# Patient Record
Sex: Male | Born: 1955 | Race: White | Hispanic: No | Marital: Single | Smoking: Current every day smoker
Health system: Southern US, Community
[De-identification: ages and names within clinical notes are randomized; demographics above are authoritative.]

## PROBLEM LIST (undated history)

## (undated) DIAGNOSIS — T7840XA Allergy, unspecified, initial encounter: Secondary | ICD-10-CM

## (undated) HISTORY — DX: Allergy, unspecified, initial encounter: T78.40XA

## (undated) HISTORY — PX: NASAL SINUS SURGERY: SHX719

---

## 2013-03-18 ENCOUNTER — Telehealth: Payer: Self-pay | Admitting: Nurse Practitioner

## 2013-03-18 NOTE — Telephone Encounter (Signed)
APPT MADE

## 2013-03-21 ENCOUNTER — Encounter: Payer: Self-pay | Admitting: Family Medicine

## 2013-03-21 ENCOUNTER — Encounter (INDEPENDENT_AMBULATORY_CARE_PROVIDER_SITE_OTHER): Payer: Self-pay

## 2013-03-21 ENCOUNTER — Ambulatory Visit (INDEPENDENT_AMBULATORY_CARE_PROVIDER_SITE_OTHER): Payer: BC Managed Care – PPO | Admitting: Family Medicine

## 2013-03-21 ENCOUNTER — Ambulatory Visit (INDEPENDENT_AMBULATORY_CARE_PROVIDER_SITE_OTHER): Payer: BC Managed Care – PPO

## 2013-03-21 VITALS — BP 143/82 | HR 76 | Temp 97.4°F | Ht 68.0 in | Wt 185.4 lb

## 2013-03-21 DIAGNOSIS — F17200 Nicotine dependence, unspecified, uncomplicated: Secondary | ICD-10-CM

## 2013-03-21 DIAGNOSIS — M940 Chondrocostal junction syndrome [Tietze]: Secondary | ICD-10-CM

## 2013-03-21 NOTE — Patient Instructions (Signed)

## 2013-03-21 NOTE — Progress Notes (Signed)
   Subjective:    Patient ID: Grant Lawson, male    DOB: 04/29/1955, 58 y.o.   MRN: [id number]  HPI This 58 y.o. male presents for evaluation of left chest mass.  He has noticed over the Past few months a hard nodular area on his left costal area.  He is a smoker   Review of Systems No chest pain, SOB, HA, dizziness, vision change, N/V, diarrhea, constipation, dysuria, urinary urgency or frequency, myalgias, arthralgias or rash.     Objective:   Physical Exam  Vital signs noted  Well developed well nourished male.  HEENT - Head atraumatic Normocephalic Respiratory - Lungs CTA bilateral Cardiac - RRR S1 and S2 without murmur MS - Left chest with hard nodular area over costal and prominent manubrium/sternum Notch.     CXR - No infiltrates Prelimnary reading by Angeline SlimWilliam Kendryck Lacroix,FNP Assessment & Plan:  Smoker - Plan: DG Chest 2 View  Costochondritis - Plan: DG Chest 2 View  Reassured patient that this is probably not cancer and is probably just costochondritis. Recommend he follow up for CPE.  Deatra CanterWilliam J Brisa Auth FNP

## 2013-03-25 ENCOUNTER — Telehealth: Payer: Self-pay | Admitting: Family Medicine

## 2013-03-26 NOTE — Telephone Encounter (Signed)
Pt wants xray results. Can you review? Thanks.

## 2013-03-27 NOTE — Telephone Encounter (Signed)
Can you discuss with patient mom

## 2013-03-27 NOTE — Telephone Encounter (Signed)
He has small lipoma over manubrium which is benign and would just watch

## 2014-06-25 ENCOUNTER — Ambulatory Visit (INDEPENDENT_AMBULATORY_CARE_PROVIDER_SITE_OTHER): Payer: BLUE CROSS/BLUE SHIELD | Admitting: Family Medicine

## 2014-06-25 ENCOUNTER — Ambulatory Visit (INDEPENDENT_AMBULATORY_CARE_PROVIDER_SITE_OTHER): Payer: BLUE CROSS/BLUE SHIELD

## 2014-06-25 ENCOUNTER — Encounter: Payer: Self-pay | Admitting: Family Medicine

## 2014-06-25 VITALS — BP 122/80 | HR 75 | Temp 97.3°F | Ht 68.0 in | Wt 178.4 lb

## 2014-06-25 DIAGNOSIS — J209 Acute bronchitis, unspecified: Secondary | ICD-10-CM

## 2014-06-25 DIAGNOSIS — R05 Cough: Secondary | ICD-10-CM

## 2014-06-25 DIAGNOSIS — M791 Myalgia: Secondary | ICD-10-CM | POA: Diagnosis not present

## 2014-06-25 DIAGNOSIS — M609 Myositis, unspecified: Secondary | ICD-10-CM

## 2014-06-25 DIAGNOSIS — Z720 Tobacco use: Secondary | ICD-10-CM

## 2014-06-25 DIAGNOSIS — F1910 Other psychoactive substance abuse, uncomplicated: Secondary | ICD-10-CM

## 2014-06-25 DIAGNOSIS — J40 Bronchitis, not specified as acute or chronic: Secondary | ICD-10-CM | POA: Diagnosis not present

## 2014-06-25 DIAGNOSIS — IMO0001 Reserved for inherently not codable concepts without codable children: Secondary | ICD-10-CM

## 2014-06-25 DIAGNOSIS — R059 Cough, unspecified: Secondary | ICD-10-CM

## 2014-06-25 LAB — POCT CBC
Granulocyte percent: 84.5 %G — AB (ref 37–80)
HCT, POC: 53 % (ref 43.5–53.7)
Hemoglobin: 16.8 g/dL (ref 14.1–18.1)
Lymph, poc: 1.6 (ref 0.6–3.4)
MCH, POC: 28.9 pg (ref 27–31.2)
MCHC: 31.6 g/dL — AB (ref 31.8–35.4)
MCV: 91.5 fL (ref 80–97)
MPV: 8 fL (ref 0–99.8)
POC Granulocyte: 10.4 — AB (ref 2–6.9)
POC LYMPH PERCENT: 13 %L (ref 10–50)
Platelet Count, POC: 222 10*3/uL (ref 142–424)
RBC: 5.79 M/uL (ref 4.69–6.13)
RDW, POC: 13.7 %
WBC: 12.3 10*3/uL — AB (ref 4.6–10.2)

## 2014-06-25 LAB — POCT INFLUENZA A/B
Influenza A, POC: NEGATIVE
Influenza B, POC: NEGATIVE

## 2014-06-25 MED ORDER — AZITHROMYCIN 250 MG PO TABS: ORAL_TABLET | ORAL | Status: AC

## 2014-06-25 MED ORDER — ALBUTEROL SULFATE HFA 108 (90 BASE) MCG/ACT IN AERS: 2.0000 | INHALATION_SPRAY | Freq: Four times a day (QID) | RESPIRATORY_TRACT | Status: AC | PRN

## 2014-06-25 NOTE — Progress Notes (Signed)
Subjective:    Patient ID: Grant Lawson, male    DOB: [birth year], 57 y.o.   MRN: [id number]  HPI Patient here today for sinus, cough and fever that started over the weekend or actually since 2 days ago. The chest has been sore and he is not had any sore throat or problems voiding. The patient is a smoker.        There are no active problems to display for this patient.  No outpatient encounter prescriptions on file as of 06/25/2014.   Review of Systems  Constitutional: Positive for fever.  HENT: Positive for sinus pressure.   Eyes: Negative.   Respiratory: Positive for cough.   Cardiovascular: Negative.   Gastrointestinal: Negative.   Endocrine: Negative.   Genitourinary: Negative.   Musculoskeletal: Negative.   Skin: Negative.   Allergic/Immunologic: Negative.   Neurological: Negative.   Hematological: Negative.   Psychiatric/Behavioral: Negative.        Objective:   Physical Exam  Constitutional: He is oriented to person, place, and time. He appears well-developed and well-nourished. No distress.  HENT:  Head: Normocephalic and atraumatic.  Right Ear: External ear normal.  Left Ear: External ear normal.  Mouth/Throat: Oropharynx is clear and moist. No oropharyngeal exudate.  Nasal congestion bilaterally  Eyes: Conjunctivae and EOM are normal. Pupils are equal, round, and reactive to light. Right eye exhibits no discharge. Left eye exhibits no discharge. No scleral icterus.  Neck: Normal range of motion. Neck supple. No thyromegaly present.  No anterior cervical nodes  Cardiovascular: Normal rate, regular rhythm and normal heart sounds.   No murmur heard. At 72/m  Pulmonary/Chest: Effort normal. No respiratory distress. He has wheezes. He has no rales. He exhibits no tenderness.  Rhonchi and wheezes in right chest posteriorly. Patient has congested cough  Abdominal: Soft. Bowel sounds are normal. He exhibits no mass. There is no tenderness. There is no rebound and  no guarding.  Musculoskeletal: Normal range of motion. He exhibits no edema.  Lymphadenopathy:    He has no cervical adenopathy.  Neurological: He is alert and oriented to person, place, and time.  Skin: Skin is warm and dry. No rash noted. No erythema. No pallor.  Psychiatric: He has a normal mood and affect. His behavior is normal. Judgment and thought content normal.  Nursing note and vitals reviewed.  BP 148/94 mmHg  Pulse 75  Temp(Src) 97.3 F (36.3 C) (Oral)  Ht  (1.727 m)  Wt 178 lb 6.4 oz (80.922 kg)  BMI 27.13 kg/m2  Repeat blood pressure 122/80 right arm sitting [facility] reading (PRIMARY) by  Dr. Tracie Harrier x-ray-- no active disease                                 The CBC has a white blood cell count that is elevated at 12.3 and hemoglobin that is good at 16.8.     Assessment & Plan:  1. Bronchitis with bronchospasm -Take Mucinex and antibiotic as directed - POCT CBC - DG Chest 2 View; Future  2. Myalgia and myositis -Take Tylenol for aches pains and fever - POCT CBC - POCT Influenza A/B  3. Cough -Drink plenty of fluids and take Mucinex as directed - DG Chest 2 View; Future  4. Nicotine abuse -The patient was encouraged to try to stop smoking. - DG Chest 2 View; Future  Meds ordered this encounter  Medications  . azithromycin (ZITHROMAX) 250  MG tablet    Sig: 2 pills the first day then one daily for infection until completed    Dispense:  6 tablet    Refill:  0  . albuterol (PROVENTIL HFA;VENTOLIN HFA) 108 (90 BASE) MCG/ACT inhaler    Sig: Inhale 2 puffs into the lungs every 6 (six) hours as needed for wheezing or shortness of breath.    Dispense:  1 Inhaler    Refill:  2   Patient Instructions  The patient should drink plenty of fluids He should take Mucinex, maximum strength, blue and white in color, 1 twice daily with a large glass of water for cough and congestion He should use the inhaler 1 puff 4 times daily if needed for wheezing He should  take the antibiotic as directed He should stop smoking   Nyra Capeson W. Reaver MD

## 2014-06-25 NOTE — Patient Instructions (Signed)
The patient should drink plenty of fluids He should take Mucinex, maximum strength, blue and white in color, 1 twice daily with a large glass of water for cough and congestion He should use the inhaler 1 puff 4 times daily if needed for wheezing He should take the antibiotic as directed He should stop smoking

## 2015-02-28 IMAGING — CR DG CHEST 2V
2 series · 2 of 2 positions shown · non-contrast
Comparison: None.

CLINICAL DATA: Palpable mass.  Smoker.

EXAM:
CHEST  2 VIEW

[view not recorded (1 of 2)]
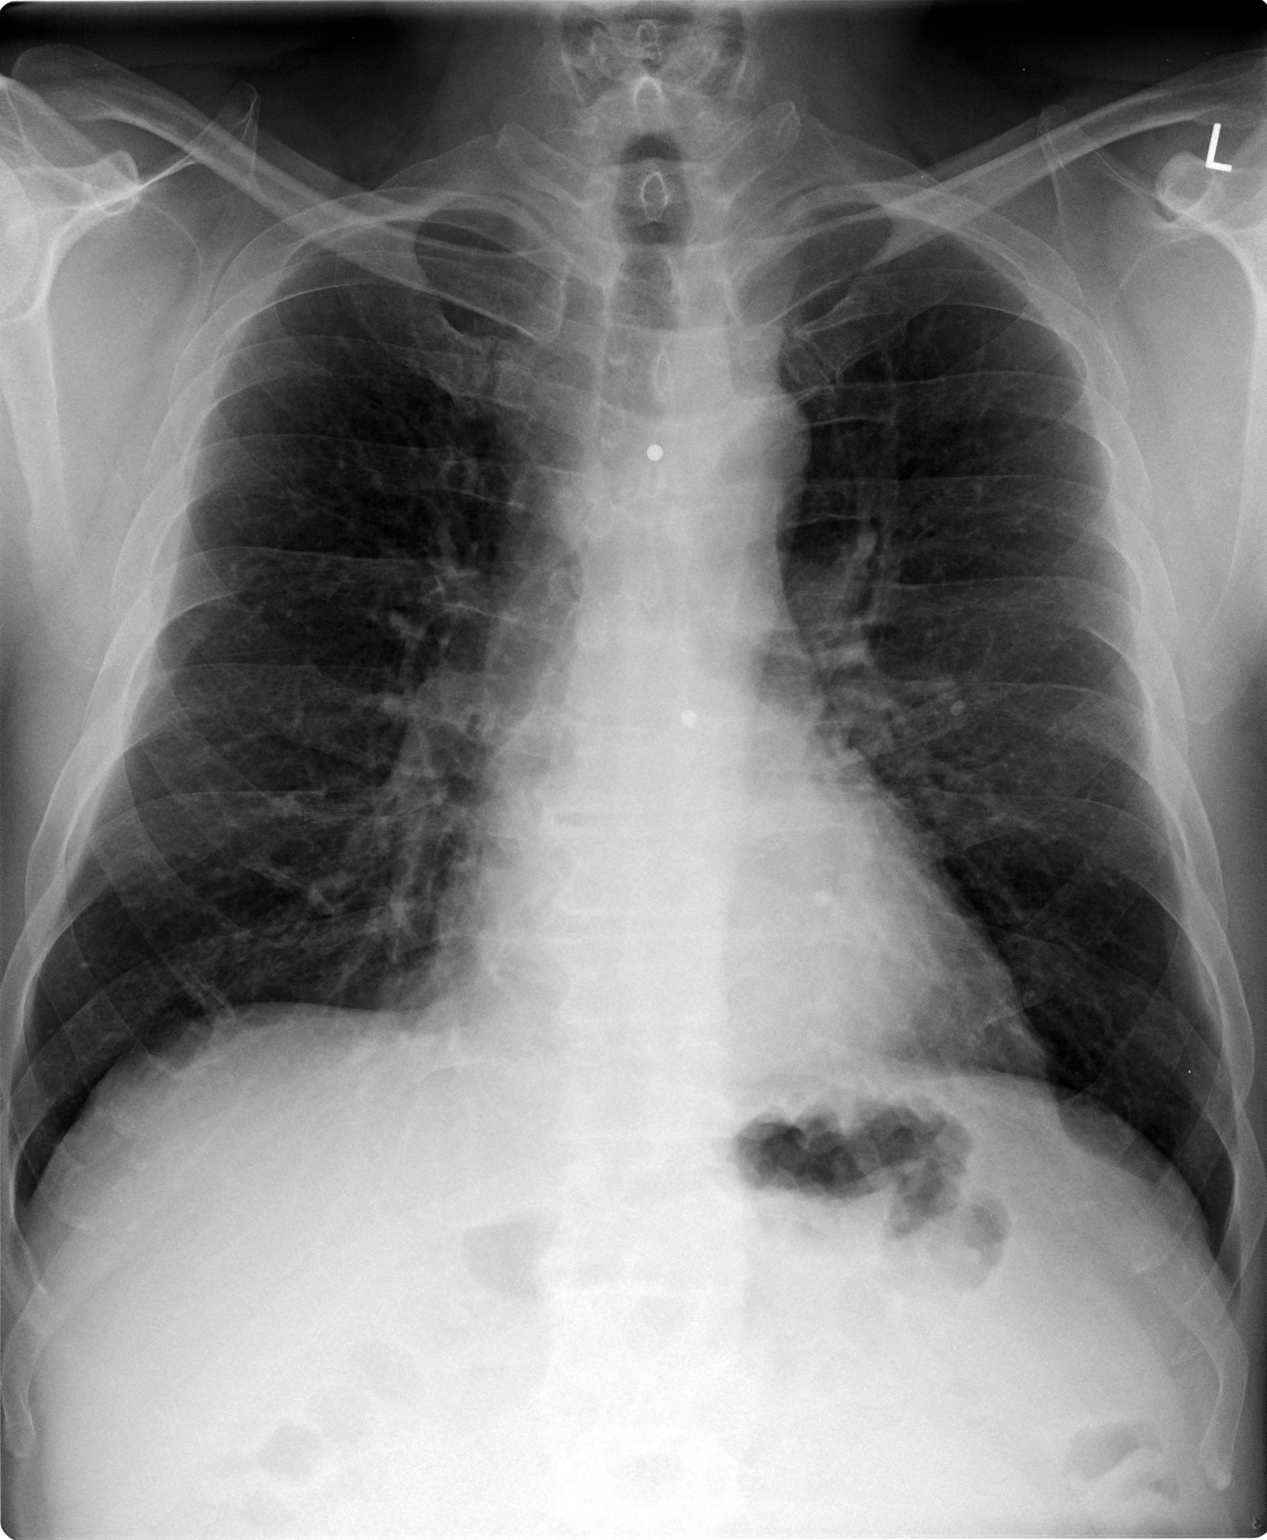

[view not recorded (2 of 2)]
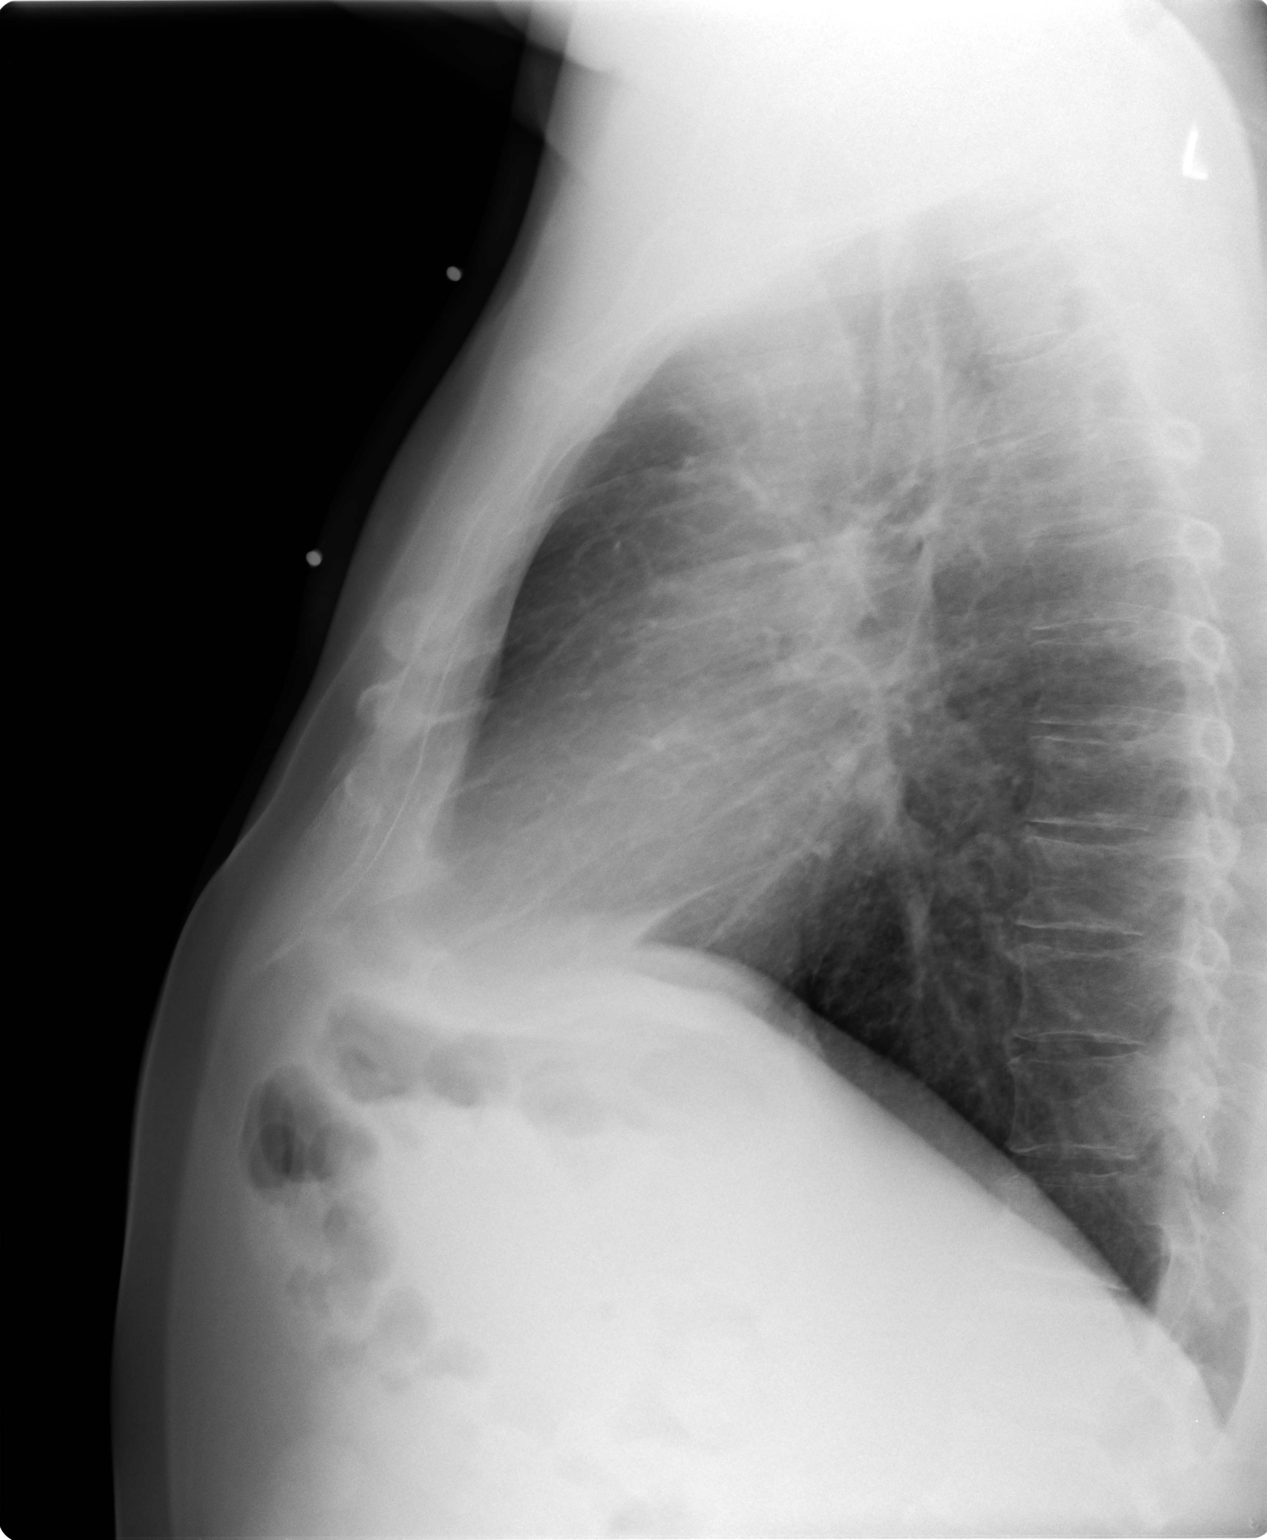

[2 of 2 positions shown; findings below may reference images not displayed]

FINDINGS: Trachea is midline. Heart size within normal limits. Probable tiny
calcified granuloma at the right lung base. Lungs are otherwise
clear. No pleural fluid.

Cutaneous markers over the sternum outline a focal area of soft
tissue prominence, which appears to be fat in density.
IMPRESSION: 1. No acute findings in the chest.
2. Possible lipoma overlying the sternum. Please correlate
clinically.

## 2016-06-03 IMAGING — CR DG CHEST 2V
2 series · 2 of 2 positions shown · non-contrast
Comparison: PA and lateral chest of March 21, 2013

CLINICAL DATA: Congestion and wheezing

EXAM:
CHEST  2 VIEW

[view not recorded (1 of 2)]
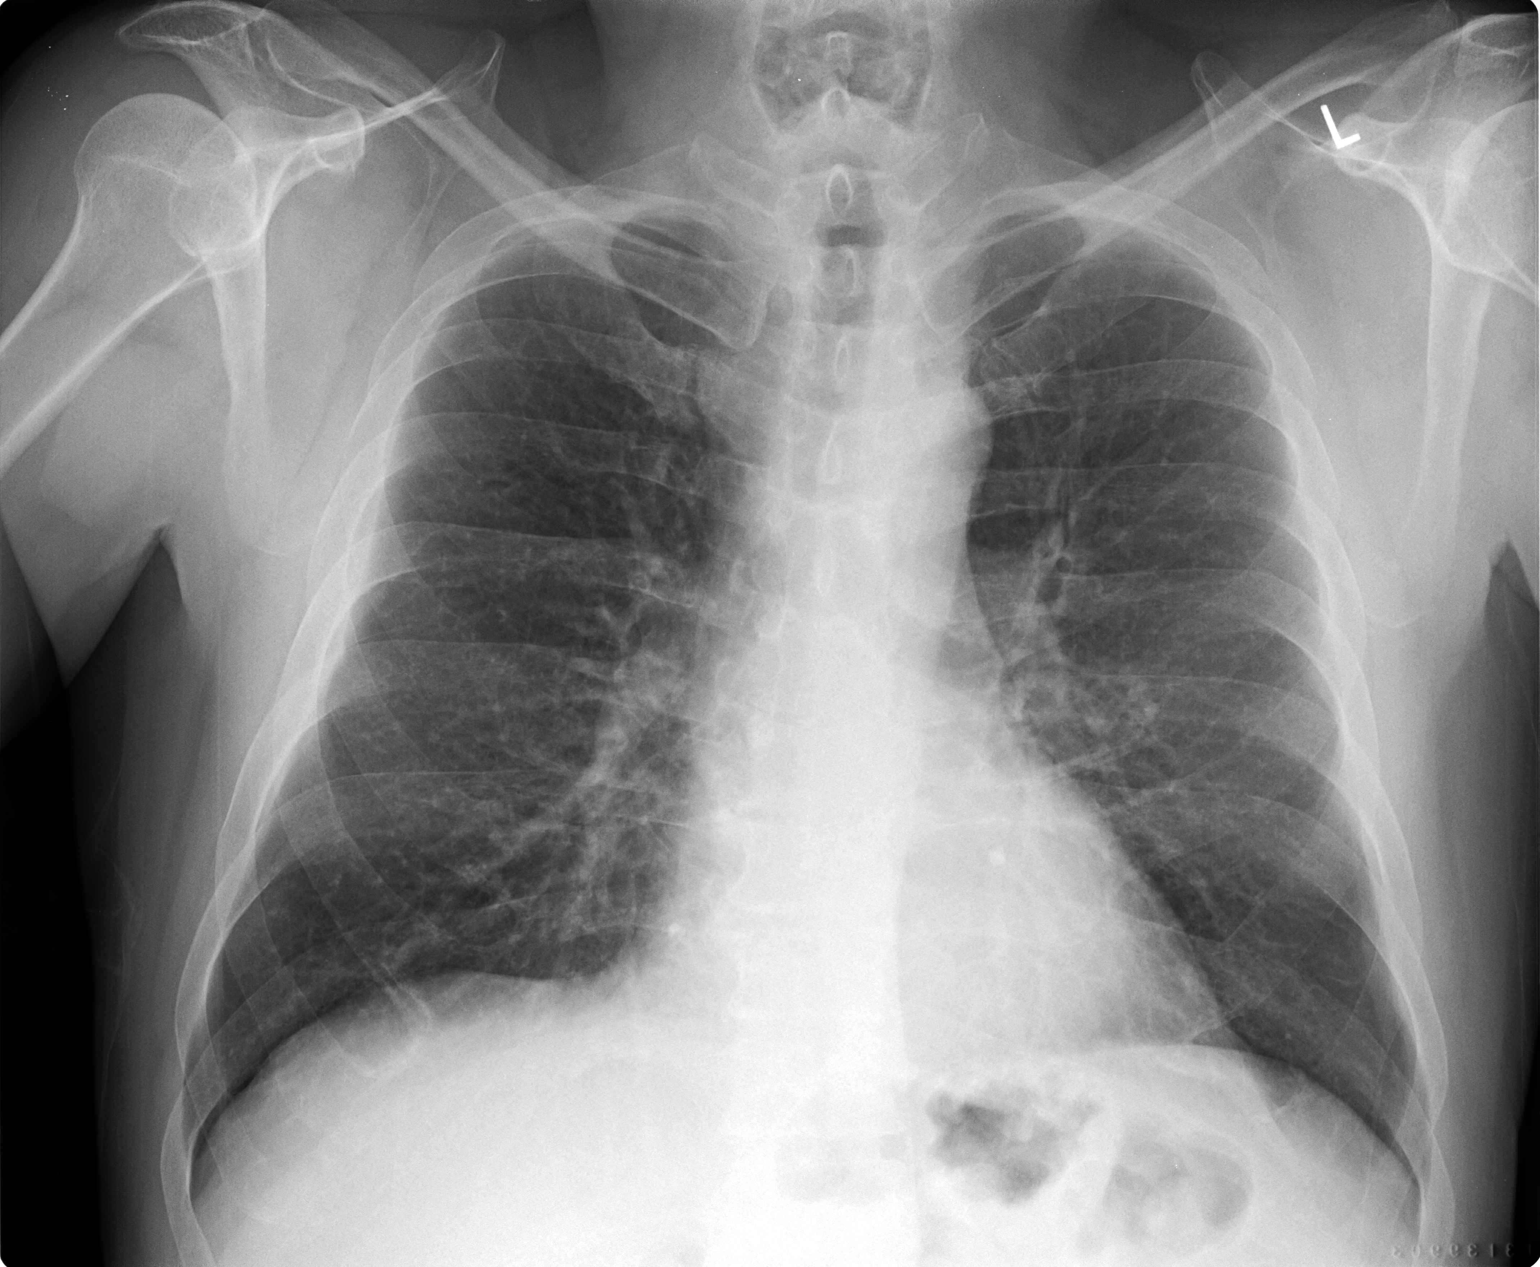

[view not recorded (2 of 2)]
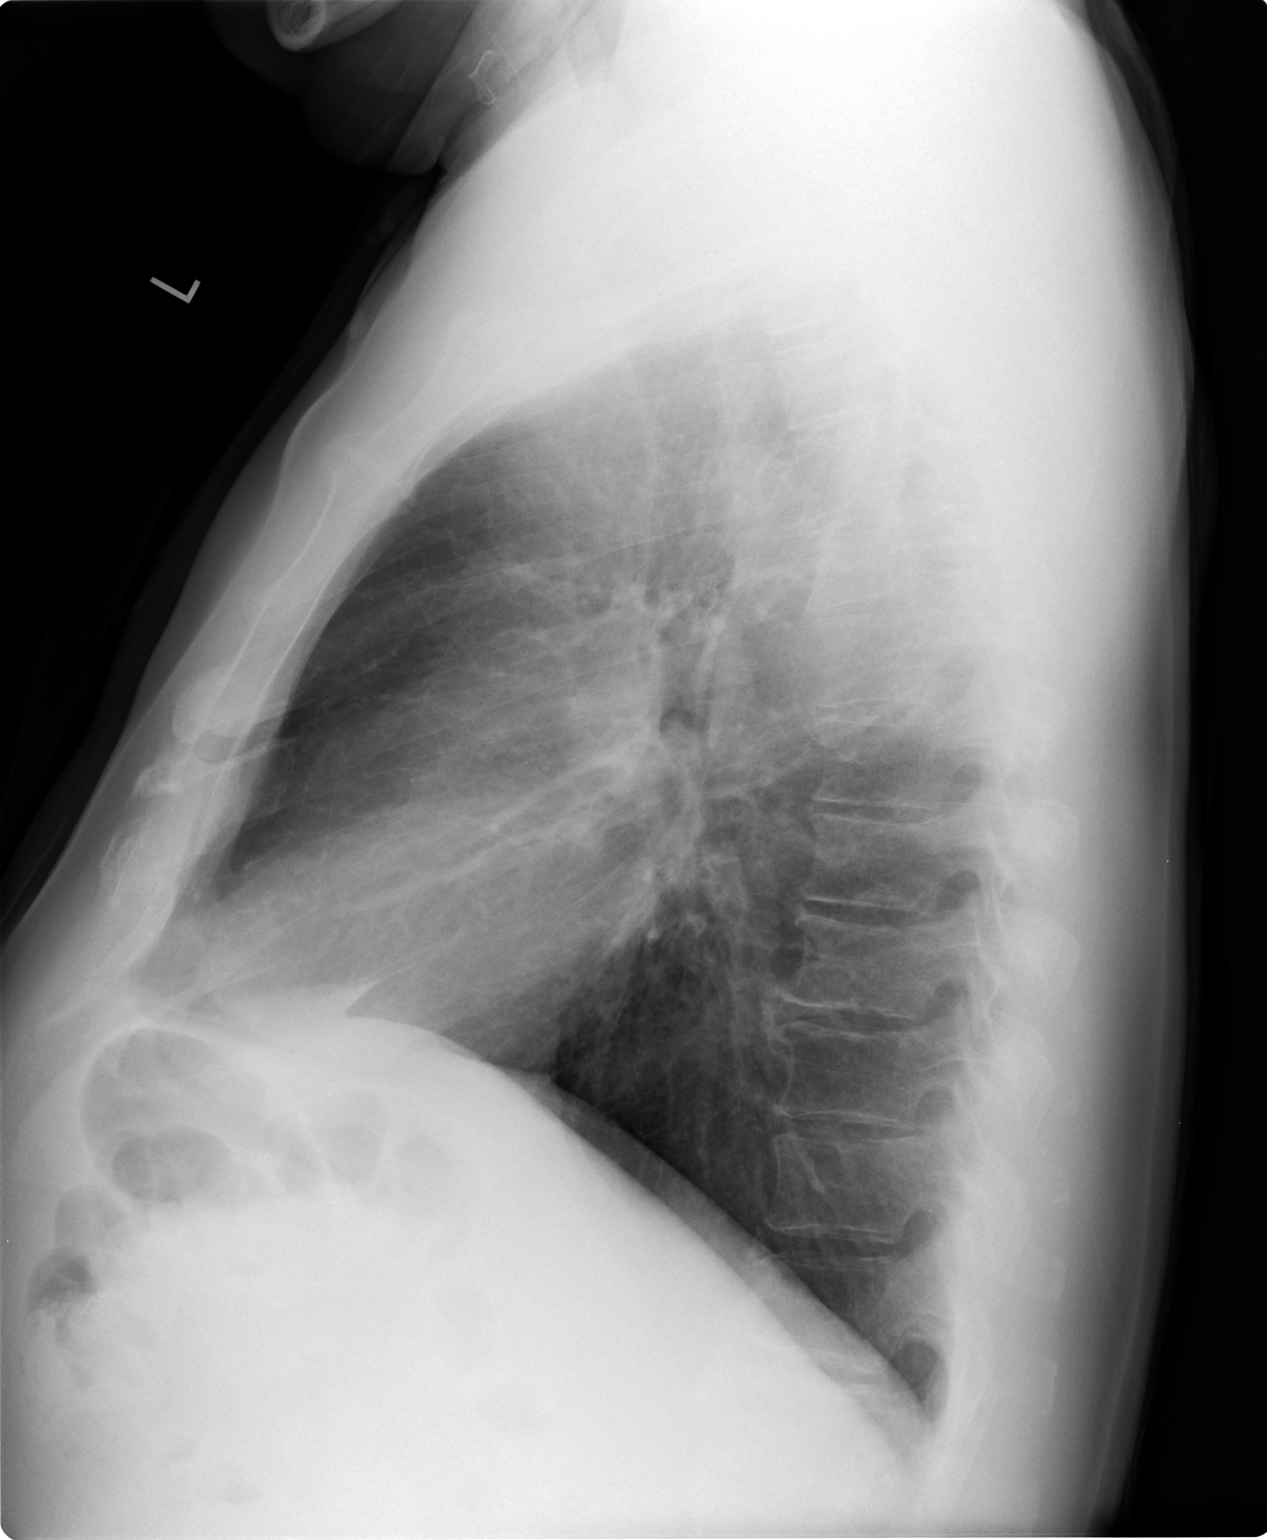

[2 of 2 positions shown; findings below may reference images not displayed]

FINDINGS: The lungs are mildly hyperinflated and clear. The heart and
pulmonary vascularity are normal. The mediastinum is normal in
width. There is no pleural effusion. The bony thorax is
unremarkable. The soft tissues over the sternum are stable.
IMPRESSION: COPD.  There is no active cardiopulmonary disease.
